# Patient Record
Sex: Female | Born: 1980 | Race: White | Hispanic: No | Marital: Single | Smoking: Never smoker
Health system: Southern US, Community
[De-identification: ages and names within clinical notes are randomized; demographics above are authoritative.]

## PROBLEM LIST (undated history)

## (undated) DIAGNOSIS — O0930 Supervision of pregnancy with insufficient antenatal care, unspecified trimester: Secondary | ICD-10-CM

## (undated) DIAGNOSIS — Z8619 Personal history of other infectious and parasitic diseases: Secondary | ICD-10-CM

## (undated) DIAGNOSIS — Z789 Other specified health status: Secondary | ICD-10-CM

## (undated) HISTORY — DX: Personal history of other infectious and parasitic diseases: Z86.19

## (undated) HISTORY — DX: Supervision of pregnancy with insufficient antenatal care, unspecified trimester: O09.30

## (undated) HISTORY — PX: NO PAST SURGERIES: SHX2092

---
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
## 2003-10-26 ENCOUNTER — Emergency Department (HOSPITAL_COMMUNITY): Admission: EM | Admit: 2003-10-26 | Discharge: 2003-10-26 | Payer: Self-pay | Admitting: Family Medicine

## 2004-12-05 ENCOUNTER — Other Ambulatory Visit: Admission: RE | Admit: 2004-12-05 | Discharge: 2004-12-05 | Payer: Self-pay | Admitting: Obstetrics and Gynecology

## 2005-06-20 ENCOUNTER — Inpatient Hospital Stay (HOSPITAL_COMMUNITY): Admission: AD | Admit: 2005-06-20 | Discharge: 2005-06-22 | Payer: Self-pay | Admitting: Obstetrics and Gynecology

## 2007-02-10 ENCOUNTER — Emergency Department (HOSPITAL_COMMUNITY): Admission: EM | Admit: 2007-02-10 | Discharge: 2007-02-10 | Payer: Self-pay | Admitting: Family Medicine

## 2007-02-13 ENCOUNTER — Emergency Department (HOSPITAL_COMMUNITY): Admission: EM | Admit: 2007-02-13 | Discharge: 2007-02-13 | Payer: Self-pay | Admitting: Emergency Medicine
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
## 2009-04-26 ENCOUNTER — Encounter: Admission: RE | Admit: 2009-04-26 | Discharge: 2009-04-26 | Payer: Self-pay | Admitting: Family Medicine

## 2010-09-23 NOTE — H&P (Signed)
. [facility]  Patient:    Christine Morrow, Christine Morrow Visit Number: [id number] MRN: [id number]          Service Type: Attending:  Naima A. Normand Sloop, M.D. Dictated by:   Nigel Bridgeman, C.N.M. Adm. Date:  12/25/00                           History and Physical  DATE OF BIRTH:                02/20/1981  HISTORY:                      Christine Morrow is an 30 year old gravida 1, para 0 at 3 3/7 weeks who presents today for induction.  Pregnancy has been remarkable for late transfer from [facility], adolescent, positive group B strep, post dates.  HISTORY OF PRESENT PREGNANCY: Patient entered care at [facility] at approximately 11 weeks.  She was followed at [facility] until approximately 27 weeks and transferred to [facility] secondary to medicaid status.  Patient was diagnosed with beta strep at that first visit.  She then transferred [facility] at 29 weeks.  She had a glucola that was normal.  The rest of her pregnancy was essentially uncomplicated.  She had a labor check at 39 weeks which showed her cervix to be 2 cm.  She had an NST at 41 weeks.  At that time her cervix was still 2, 80%, vertex, -1.  Plan was made to schedule her for induction on December 25, 2000 with Pitocin.  PRENATAL LABORATORIES:        Blood type A+.  Rh antibody negative.  VDRL nonreactive.  Rubella titer positive.  Hepatitis B surface antigen negative. GC and chlamydia cultures negative.  Glucose challenge normal.  Hemoglobin upon entry into practice was within normal limits.  AFP was not done secondary to too late.  Beta strep was positive.  Pap showed inflammatory change in May.  EDC of December 15, 2000 was established by last menstrual period and was in agreement with ultrasound at approximately 18 weeks.  Group B strep culture was positive in the early part of her pregnancy.  Glucose challenge was negative.  PAST OBSTETRICAL HISTORY:      Patient is a primigravida.  PAST MEDICAL HISTORY:         She was on Ortho Tri-Cyclen until approximately one year ago.  She has had one yeast infection in the past.  She reports usual childhood illnesses.  ALLERGIES:                    No known drug allergies.  FAMILY HISTORY:               Her maternal grandfather had a heart attack. Her mother has emphysema.  Her brother has ADHD and depression.  GENETIC HISTORY:              Remarkable for the father of the babys nephew born with one extra toe and one extra finger.  SOCIAL HISTORY:               Patient is single.  The father of the baby is involved and supportive.  His name is Christine Morrow.  Patient is Caucasian.  She has a ninth grade education.  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  Her partner also has a ninth grade  education and is employed at a [employer].  She has been followed by the certified nurse midwife service at [facility].  She denies any alcohol, drug, or tobacco use during this pregnancy.  PHYSICAL EXAMINATION  VITAL SIGNS:                  Vital signs are stable.  Patient is afebrile.  HEENT:                        Within normal limits.  LUNGS:                        Bilateral breath sounds are clear.  HEART:                        Regular rate and rhythm without murmur.  BREASTS:                      Soft and nontender.  ABDOMEN:                      Fundal height approximately 39 cm.  Estimated fetal weight 7-7.5 pounds.  Uterine contractions are every five to seven minutes, mild quality.  Fetal heart rate is reactive with no decelerations.  PELVIC:                       Cervix is 2-3 cm, 80%, vertex at a -1 station.  EXTREMITIES:                  Deep tendon reflexes are 2+ without clonus. There is trace edema noted.  IMPRESSION:                   1. Intrauterine pregnancy at 41 3/7 weeks.                               2. Favorable cervix.                               3. Positive group B  strep.  PLAN:                         1. Admit to the birthing suite for consult with                                  Dr. Jaymes Graff as attending physician.                               2. Routine certified nurse midwife orders.                               3. Plan group B strep prophylaxis penicillin G                                  per standard dosing.  4. Plan Pitocin induction of labor secondary to                                  favorable cervix in presence of uterine                                  contractions at this point. Dictated by:   Nigel Bridgeman, C.N.M. Attending:  Naima A. Normand Sloop, M.D. DD:  12/28/00 TD:  12/28/00 Job: [id number]

## 2010-09-23 NOTE — H&P (Signed)
Christine Morrow, [name]:  [id number]   MEDICAL RECORD NO.:  [id number]          PATIENT TYPE:  MAT   LOCATION:  [facility]                          FACILITY:  [facility]   PHYSICIAN:  Osborn Coho, M.D.   DATE OF BIRTH:  [birth year]   DATE OF ADMISSION:  06/20/2005  DATE OF DISCHARGE:                                HISTORY & PHYSICAL   HISTORY OF PRESENT ILLNESS:  [name] is a 30 year old gravida 2, para 1-0-  0-1 at 38-3/7 weeks, July 01, 2005, who presents on admission in early  active labor with spontaneous rupture of membranes with clear fluid. She  reports positive fetal movement. No vaginal bleeding. She was seen earlier  today in MAU and sent home with reactive NST. She was known be contracting  every to 1-2 minutes at that time, however, her cervix was unchanged at 2,  70% and -2. She denies any headache, visual changes or epigastric pain. Her  pregnancy has been followed by the C.N.M. service at [facility] and is remarkable  for:  1.  Positive chlamydia at new OB visit on December 05, 2004. She had a negative      test of cure on December 31, 2004.  2.  She is varicella equivocal.  3.  Group B strep positive.   This patient presented to prenatal care on December 05, 2004 at approximately 10  weeks' gestation. Estimated date of confinement determined a 16-weeks  ultrasound and confirmed with follow-up. The patient's pregnancy has been  essentially unremarkable. She has been size equal to dates throughout. She  has been normotensive with no proteinuria. Prenatal lab work on July  31,2006: hemoglobin and hematocrit 12.6 and 37.2, platelets 262,000. Blood  type and Rh A positive, antibody screen negative, VDRL nonreactive, rubella  immune, hepatitis B surface antigen negative, HIV nonreactive. Pap smear  within normal limits. GC negative, chlamydia positive on December 05, 2004; test  of cure was negative on January 02, 2005. At 36 weeks culture of the vaginal  tract is negative  for GC and chlamydia and quadruple screen is within normal  limits. 28-week 1-hour Glucola 117. RPR nonreactive. Varicella equivocal.  Then again at 36 weeks culture of the vaginal tract is negative for GC and  chlamydia. The patient is positive for group B strep.   OBSTETRIC HISTORY:  In 2002 the patient had a normal spontaneous vaginal  delivery at term with birth of an 8 pound, 0 ounce female infant named Christine Morrow  with no complications. This is her second and current pregnancy.   ALLERGIES:  No known drug allergies.   HABITS:  She denies the use of tobacco, alcohol or illicit drugs.   PAST MEDICAL HISTORY:  History of positive chlamydia at new OB visit with  test of cure negative.   FAMILY HISTORY:  Maternal grandmother with a history of heart disease. The  patient's father with a history of diabetes. The patient's brother has  bipolar disorder and ADHD. Mother smokes cigarettes.   GENETIC HISTORY:  The father of the baby's sister had a child with  a extra  fingers and extra toes.   SOCIAL HISTORY:  [name] is a 30 year old single Caucasian female.  Candelaria Celeste the father of the baby is sporadically involved.  They do not  subscribe to a religious faith.   REVIEW OF SYSTEMS:  There are no signs or symptoms suggestive of focal or  systemic disease. The patient is typical of one with uterine pregnancy at  term in early active labor.   PHYSICAL EXAMINATION:  VITAL SIGNS:  Stable, afebrile.  HEENT:  Unremarkable.  HEART:  Regular rate and rhythm.  LUNGS:  Clear.  ABDOMEN:  Gravid in its contour. Uterine fundus is noted to extend 38 cm  above the level of the pubic symphysis. Leopold's maneuvers finds the infant  to be a longitudinal lie, cephalic presentation and the estimated fetal  weight is 7-1/2 pounds. The baseline of the fetal heart rate monitor is  130's with average long-term variability, reactivity is present with no  periodic changes. The patient is contracting  every 2 minutes. Digital exam  of the cervix finds it to be 3-4 cm dilated, 90% effaced with the cephalic  presenting part at -1 station. Amniotic fluid is clear.  EXTREMITIES:  Show no pathologic edema. DTRs 1+ with no clonus. There is no  calf tenderness noted bilaterally.   ASSESSMENT:  Intrauterine pregnancy at term, early active labor.   PLAN:  Admit to routine C.N.M. orders. Penicillin G prophylaxis for positive  group B strep.      Rica Koyanagi, C.N.M.      Osborn Coho, M.D.  Electronically Signed    SDM/MEDQ  D:  06/20/2005  T:  06/20/2005  Job:  [id number]

## 2011-02-16 LAB — URINALYSIS, ROUTINE W REFLEX MICROSCOPIC
Bilirubin Urine: NEGATIVE
Hgb urine dipstick: NEGATIVE
Ketones, ur: 40 — AB
Nitrite: NEGATIVE
Urobilinogen, UA: 1

## 2011-02-16 LAB — RAPID STREP SCREEN (MED CTR MEBANE ONLY): Streptococcus, Group A Screen (Direct): NEGATIVE

## 2011-03-08 LAB — OB RESULTS CONSOLE RPR: RPR: NONREACTIVE

## 2011-03-08 LAB — OB RESULTS CONSOLE HIV ANTIBODY (ROUTINE TESTING): HIV: NONREACTIVE

## 2011-03-08 LAB — OB RESULTS CONSOLE ABO/RH

## 2011-03-08 LAB — OB RESULTS CONSOLE GC/CHLAMYDIA: Gonorrhea: NEGATIVE

## 2011-03-08 LAB — OB RESULTS CONSOLE RUBELLA ANTIBODY, IGM: Rubella: IMMUNE

## 2011-05-09 NOTE — L&D Delivery Note (Signed)
Delivery Note At 1:57 PM a healthy female was delivered via Vaginal, Spontaneous Delivery (Presentation: Right Occiput Anterior). Compound right arm. APGAR: 9, 9; weight pending.   Placenta status: Intact, Spontaneous.  Cord: 3 vessels with the following complications: None.  Cord pH: NA  Anesthesia: Epidural  Episiotomy: None Lacerations: None Suture Repair: none Est. Blood Loss (mL): 400 Cytotec given  Mom to postpartum.  Baby to nursery-stable. Placenta to: BS Feeding: Breast Circ: NA Contraception: undecided  Dorathy Kinsman 09/03/2011, 2:26 PM

## 2011-07-12 ENCOUNTER — Encounter (INDEPENDENT_AMBULATORY_CARE_PROVIDER_SITE_OTHER): Payer: Medicaid Other

## 2011-07-12 DIAGNOSIS — Z331 Pregnant state, incidental: Secondary | ICD-10-CM

## 2011-07-26 ENCOUNTER — Encounter (INDEPENDENT_AMBULATORY_CARE_PROVIDER_SITE_OTHER): Payer: Medicaid Other | Admitting: Obstetrics and Gynecology

## 2011-07-26 DIAGNOSIS — Z3480 Encounter for supervision of other normal pregnancy, unspecified trimester: Secondary | ICD-10-CM

## 2011-08-07 ENCOUNTER — Inpatient Hospital Stay (HOSPITAL_COMMUNITY)
Admission: AD | Admit: 2011-08-07 | Discharge: 2011-08-08 | Disposition: A | Discharge status: Home / Self Care | Payer: Medicaid Other | Source: Ambulatory Visit | Attending: Obstetrics and Gynecology | Admitting: Obstetrics and Gynecology

## 2011-08-07 ENCOUNTER — Encounter (HOSPITAL_COMMUNITY): Payer: Self-pay | Admitting: *Deleted

## 2011-08-07 DIAGNOSIS — O4700 False labor before 37 completed weeks of gestation, unspecified trimester: Secondary | ICD-10-CM | POA: Insufficient documentation

## 2011-08-07 DIAGNOSIS — O479 False labor, unspecified: Secondary | ICD-10-CM

## 2011-08-07 HISTORY — DX: Other specified health status: Z78.9

## 2011-08-07 LAB — URINALYSIS, DIPSTICK ONLY
Bilirubin Urine: NEGATIVE
Hgb urine dipstick: NEGATIVE
Ketones, ur: NEGATIVE mg/dL
Nitrite: NEGATIVE
Urobilinogen, UA: 0.2 mg/dL (ref 0.0–1.0)

## 2011-08-07 LAB — WET PREP, GENITAL
Clue Cells Wet Prep HPF POC: NONE SEEN
Trich, Wet Prep: NONE SEEN

## 2011-08-07 MED ORDER — LACTATED RINGERS IV BOLUS (SEPSIS)
1000.0000 mL | Freq: Once | INTRAVENOUS | Status: AC
  Administered 2011-08-07: 1000 mL via INTRAVENOUS

## 2011-08-07 MED ORDER — BUTORPHANOL TARTRATE 2 MG/ML IJ SOLN
1.0000 mg | Freq: Once | INTRAMUSCULAR | Status: AC
  Administered 2011-08-07: 1 mg via INTRAVENOUS
  Filled 2011-08-07: qty 1

## 2011-08-07 NOTE — MAU Note (Signed)
Contractions x 4 hours, nausea, pressure, lower back pain

## 2011-08-08 DIAGNOSIS — O4700 False labor before 37 completed weeks of gestation, unspecified trimester: Secondary | ICD-10-CM

## 2011-08-08 DIAGNOSIS — N898 Other specified noninflammatory disorders of vagina: Secondary | ICD-10-CM

## 2011-08-08 LAB — GC/CHLAMYDIA PROBE AMP, GENITAL
Chlamydia, DNA Probe: NEGATIVE
GC Probe Amp, Genital: NEGATIVE

## 2011-08-08 NOTE — Discharge Instructions (Signed)
Preventing Preterm Labor Preterm labor is when a pregnant woman has contractions that cause the cervix to open, shorten, and thin before 37 weeks of pregnancy. You will have regular contractions (tightening) 2 to 3 minutes apart. This usually causes discomfort or pain. HOME CARE  Eat a healthy diet.   Take your vitamins as told by your doctor.   Drink enough fluids to keep your pee (urine) clear or pale yellow every day.   Get rest and sleep.   Do not have sex if you are at high risk for preterm labor.   Follow your doctor's advice about activity, medicines, and tests.   Avoid stress.   Avoid hard labor or exercise that lasts for a long time.   Do not smoke.  GET HELP RIGHT AWAY IF:   You are having contractions.   You have belly (abdominal) pain.   You have bleeding from your vagina.   You have pain when you pee (urinate).   You have abnormal discharge from your vagina.   You have a temperature by mouth above 102 F (38.9 C).  MAKE SURE YOU:  Understand these instructions.   Will watch your condition.   Will get help if you are not doing well or get worse.  Document Released: 07/21/2008 Document Revised: 04/13/2011 Document Reviewed: 07/21/2008 Exit[employer] Patient Information 2012 [employer]. 

## 2011-08-08 NOTE — MAU Provider Note (Signed)
History   Christine Morrow is a 30y.o. White female who presents for PTL eval after calling around 2100 to report ctxs since 1730.  Also c/o mucousy d/c today and nausea, but no vomiting.  No VB or LOF.  Reporting "back pain," and feeling constipated.  Reported ctxs q 6-8 minutes.  Pt works full-time at [employer].  She is accompanied by her s.o.  Denies any fever, chills, illness, or resp c/o's.  Reports fetus very active tonight.  Last Intercourse 1-2 weeks ago. Pregnancy r/f: 1.  H/o chlamydia 2.  Late to care  CSN: [id number]  Arrival date and time: 08/07/11 2138   First Provider Initiated Contact with Patient 08/08/11 0023      Chief Complaint  Patient presents with  . Labor Eval   HPI  OB History    Grav Para Term Preterm Abortions TAB SAB Ect Mult Living   3 2 2       2       Past Medical History  Diagnosis Date  . No pertinent past medical history     Past Surgical History  Procedure Date  . No past surgeries     Family History  Problem Relation Age of Onset  . Anesthesia problems Neg Hx     History  Substance Use Topics  . Smoking status: Never Smoker   . Smokeless tobacco: Not on file  . Alcohol Use: No    Allergies: No Known Allergies  Prescriptions prior to admission  Medication Sig Dispense Refill  . prenatal vitamin w/FE, FA (NATACHEW) 29-1 MG CHEW Chew 1 tablet by mouth daily.        ROS--see history above Physical Exam  EFM;  130, reactive, moderate variability, no decels TOCO:  Initially UC's 2-3 minutes, and after IVF and Stadol, spaced to occasional ctx  Blood pressure 127/66, pulse 74, temperature 99.2 F (37.3 C), temperature source Oral, resp. rate 18, height 5' 3.5" (1.613 m), weight 82.101 kg (181 lb).  Physical Exam  Constitutional: She is oriented to person, place, and time. She appears well-developed and well-nourished.       Anxious on arrival, but no labored breathing or grimace  Cardiovascular: Normal rate.   Respiratory: Effort  normal.  GI: Soft.       gravid  Genitourinary:       No pooling; no blood.  No lesions. Cx:  Long/closed; medium firm; -2  Neurological: She is alert and oriented to person, place, and time.  Skin: Skin is warm and dry.   .. Results for orders placed during the hospital encounter of 08/07/11 (from the past 24 hour(s))  URINALYSIS, DIPSTICK ONLY     Status: Normal   Collection Time   08/07/11  9:50 PM      Component Value Range   Specific Gravity, Urine 1.015  1.005 - 1.030    pH 7.5  5.0 - 8.0    Glucose, UA NEGATIVE  NEGATIVE (mg/dL)   Hgb urine dipstick NEGATIVE  NEGATIVE    Bilirubin Urine NEGATIVE  NEGATIVE    Ketones, ur NEGATIVE  NEGATIVE (mg/dL)   Protein, ur NEGATIVE  NEGATIVE (mg/dL)   Urobilinogen, UA 0.2  0.0 - 1.0 (mg/dL)   Nitrite NEGATIVE  NEGATIVE    Leukocytes, UA NEGATIVE  NEGATIVE   WET PREP, GENITAL     Status: Abnormal   Collection Time   08/07/11 10:15 PM      Component Value Range   Yeast Wet Prep HPF POC NONE  SEEN  NONE SEEN    Trich, Wet Prep NONE SEEN  NONE SEEN    Clue Cells Wet Prep HPF POC NONE SEEN  NONE SEEN    WBC, Wet Prep HPF POC FEW (*) NONE SEEN    MAU Course  Procedures 1.  1 Liter LR 2.  Stadol 1 mg IV x1 3.  Wet prep 4.  Gc/ct cx--pending at time of d/c 5.  GBS cx--pending at time of d/c 6. u/a Assessment and Plan  1.  IUP at 35.6 weeks 2.  Preterm contractions which spaced after IVF and Stadol x1 3.  cx long and closed 4.  Cat I FHT  1.  D/c'd home after ctxs spaced with PTL precautions and note to remain OOW until seen at her appt Wed 08/09/11 and has cx rechecked. 2.  F/u Wed at office or prn   Christine Morrow H 08/08/2011, 12:27 AM

## 2011-08-09 ENCOUNTER — Encounter (INDEPENDENT_AMBULATORY_CARE_PROVIDER_SITE_OTHER): Payer: Medicaid Other | Admitting: Obstetrics and Gynecology

## 2011-08-09 DIAGNOSIS — Z331 Pregnant state, incidental: Secondary | ICD-10-CM

## 2011-08-09 LAB — OB RESULTS CONSOLE GBS: GBS: NEGATIVE

## 2011-08-10 LAB — CULTURE, BETA STREP (GROUP B ONLY)

## 2011-08-15 ENCOUNTER — Ambulatory Visit (INDEPENDENT_AMBULATORY_CARE_PROVIDER_SITE_OTHER): Payer: Medicaid Other | Admitting: Obstetrics and Gynecology

## 2011-08-15 VITALS — BP 100/64 | Wt 180.0 lb

## 2011-08-15 DIAGNOSIS — O0930 Supervision of pregnancy with insufficient antenatal care, unspecified trimester: Secondary | ICD-10-CM

## 2011-08-15 DIAGNOSIS — Z3480 Encounter for supervision of other normal pregnancy, unspecified trimester: Secondary | ICD-10-CM

## 2011-08-15 NOTE — Patient Instructions (Signed)
Normal Labor and Delivery Your caregiver must first be sure you are in labor. Signs of labor include:  You may pass what is called "the mucus plug" before labor begins. This is a small amount of blood stained mucus.   Regular uterine contractions.   The time between contractions get closer together.   The discomfort and pain gradually gets more intense.   Pains are mostly located in the back.   Pains get worse when walking.   The cervix (the opening of the uterus becomes thinner (begins to efface) and opens up (dilates).  Once you are in labor and admitted into the hospital or care center, your caregiver will do the following:  A complete physical examination.   Check your vital signs (blood pressure, pulse, temperature and the fetal heart rate).   Do a vaginal examination (using a sterile glove and lubricant) to determine:   The position (presentation) of the baby (head [vertex] or buttock first).   The level (station) of the baby's head in the birth canal.   The effacement and dilatation of the cervix.   You may have your pubic hair shaved and be given an enema depending on your caregiver and the circumstance.   An electronic monitor is usually placed on your abdomen. The monitor follows the length and intensity of the contractions, as well as the baby's heart rate.   Usually, your caregiver will insert an IV in your arm with a bottle of sugar water. This is done as a precaution so that medications can be given to you quickly during labor or delivery.  NORMAL LABOR AND DELIVERY IS DIVIDED UP INTO 3 STAGES: First Stage This is when regular contractions begin and the cervix begins to efface and dilate. This stage can last from 3 to 15 hours. The end of the first stage is when the cervix is 100% effaced and 10 centimeters dilated. Pain medications may be given by   Injection (morphine, demerol, etc.)   Regional anesthesia (spinal, caudal or epidural, anesthetics given in  different locations of the spine). Paracervical pain medication may be given, which is an injection of and anesthetic on each side of the cervix.  A pregnant woman may request to have "Natural Childbirth" which is not to have any medications or anesthesia during her labor and delivery. Second Stage This is when the baby comes down through the birth canal (vagina) and is born. This can take 1 to 4 hours. As the baby's head comes down through the birth canal, you may feel like you are going to have a bowel movement. You will get the urge to bear down and push until the baby is delivered. As the baby's head is being delivered, the caregiver will decide if an episiotomy (a cut in the perineum and vagina area) is needed to prevent tearing of the tissue in this area. The episiotomy is sewn up after the delivery of the baby and placenta. Sometimes a mask with nitrous oxide is given for the mother to breath during the delivery of the baby to help if there is too much pain. The end of Stage 2 is when the baby is fully delivered. Then when the umbilical cord stops pulsating it is clamped and cut. Third Stage The third stage begins after the baby is completely delivered and ends after the placenta (afterbirth) is delivered. This usually takes 5 to 30 minutes. After the placenta is delivered, a medication is given either by intravenous or injection to help contract   the uterus and prevent bleeding. The third stage is not painful and pain medication is usually not necessary. If an episiotomy was done, it is repaired at this time. After the delivery, the mother is watched and monitored closely for 1 to 2 hours to make sure there is no postpartum bleeding (hemorrhage). If there is a lot of bleeding, medication is given to contract the uterus and stop the bleeding. Document Released: 02/01/2008 Document Revised: 04/13/2011 Document Reviewed: 02/01/2008 Exit[employer] Patient Information 2012 [employer]. 

## 2011-08-15 NOTE — Progress Notes (Signed)
Cc.pt. Wants cervix check today.

## 2011-08-16 DIAGNOSIS — Z3480 Encounter for supervision of other normal pregnancy, unspecified trimester: Secondary | ICD-10-CM | POA: Insufficient documentation

## 2011-08-18 ENCOUNTER — Encounter (HOSPITAL_COMMUNITY): Payer: Self-pay | Admitting: *Deleted

## 2011-08-18 ENCOUNTER — Telehealth: Payer: Self-pay | Admitting: Obstetrics and Gynecology

## 2011-08-18 ENCOUNTER — Inpatient Hospital Stay (HOSPITAL_COMMUNITY)
Admission: AD | Admit: 2011-08-18 | Discharge: 2011-08-18 | Disposition: A | Discharge status: Home / Self Care | Payer: Medicaid Other | Source: Ambulatory Visit | Attending: Obstetrics and Gynecology | Admitting: Obstetrics and Gynecology

## 2011-08-18 DIAGNOSIS — O479 False labor, unspecified: Secondary | ICD-10-CM | POA: Insufficient documentation

## 2011-08-18 DIAGNOSIS — O4700 False labor before 37 completed weeks of gestation, unspecified trimester: Secondary | ICD-10-CM

## 2011-08-18 NOTE — Discharge Instructions (Signed)
Fetal Movement Counts Patient Name: __________________________________________________ Patient Due Date: ____________________ Kick counts is highly recommended in high risk pregnancies, but it is a good idea for every pregnant woman to do. Start counting fetal movements at 28 weeks of the pregnancy. Fetal movements increase after eating a full meal or eating or drinking something sweet (the blood sugar is higher). It is also important to drink plenty of fluids (well hydrated) before doing the count. Lie on your left side because it helps with the circulation or you can sit in a comfortable chair with your arms over your belly (abdomen) with no distractions around you. DOING THE COUNT  Try to do the count the same time of day each time you do it.   Mark the day and time, then see how long it takes for you to feel 10 movements (kicks, flutters, swishes, rolls). You should have at least 10 movements within 2 hours. You will most likely feel 10 movements in much less than 2 hours. If you do not, wait an hour and count again. After a couple of days you will see a pattern.   What you are looking for is a change in the pattern or not enough counts in 2 hours. Is it taking longer in time to reach 10 movements?  SEEK MEDICAL CARE IF:  You feel less than 10 counts in 2 hours. Tried twice.   No movement in one hour.   The pattern is changing or taking longer each day to reach 10 counts in 2 hours.   You feel the baby is not moving as it usually does.  Date: ____________ Movements: ____________ Start time: ____________ Finish time: ____________  Date: ____________ Movements: ____________ Start time: ____________ Finish time: ____________ Date: ____________ Movements: ____________ Start time: ____________ Finish time: ____________ Date: ____________ Movements: ____________ Start time: ____________ Finish time: ____________ Date: ____________ Movements: ____________ Start time: ____________ Finish time:  ____________ Date: ____________ Movements: ____________ Start time: ____________ Finish time: ____________ Date: ____________ Movements: ____________ Start time: ____________ Finish time: ____________ Date: ____________ Movements: ____________ Start time: ____________ Finish time: ____________  Date: ____________ Movements: ____________ Start time: ____________ Finish time: ____________ Date: ____________ Movements: ____________ Start time: ____________ Finish time: ____________ Date: ____________ Movements: ____________ Start time: ____________ Finish time: ____________ Date: ____________ Movements: ____________ Start time: ____________ Finish time: ____________ Date: ____________ Movements: ____________ Start time: ____________ Finish time: ____________ Date: ____________ Movements: ____________ Start time: ____________ Finish time: ____________ Date: ____________ Movements: ____________ Start time: ____________ Finish time: ____________  Date: ____________ Movements: ____________ Start time: ____________ Finish time: ____________ Date: ____________ Movements: ____________ Start time: ____________ Finish time: ____________ Date: ____________ Movements: ____________ Start time: ____________ Finish time: ____________ Date: ____________ Movements: ____________ Start time: ____________ Finish time: ____________ Date: ____________ Movements: ____________ Start time: ____________ Finish time: ____________ Date: ____________ Movements: ____________ Start time: ____________ Finish time: ____________ Date: ____________ Movements: ____________ Start time: ____________ Finish time: ____________  Date: ____________ Movements: ____________ Start time: ____________ Finish time: ____________ Date: ____________ Movements: ____________ Start time: ____________ Finish time: ____________ Date: ____________ Movements: ____________ Start time: ____________ Finish time: ____________ Date: ____________ Movements:  ____________ Start time: ____________ Finish time: ____________ Date: ____________ Movements: ____________ Start time: ____________ Finish time: ____________ Date: ____________ Movements: ____________ Start time: ____________ Finish time: ____________ Date: ____________ Movements: ____________ Start time: ____________ Finish time: ____________  Date: ____________ Movements: ____________ Start time: ____________ Finish time: ____________ Date: ____________ Movements: ____________ Start time: ____________ Finish time: ____________ Date: ____________ Movements: ____________ Start time:   ____________ Finish time: ____________ Date: ____________ Movements: ____________ Start time: ____________ Finish time: ____________ Date: ____________ Movements: ____________ Start time: ____________ Finish time: ____________ Date: ____________ Movements: ____________ Start time: ____________ Finish time: ____________ Date: ____________ Movements: ____________ Start time: ____________ Finish time: ____________  Date: ____________ Movements: ____________ Start time: ____________ Finish time: ____________ Date: ____________ Movements: ____________ Start time: ____________ Finish time: ____________ Date: ____________ Movements: ____________ Start time: ____________ Finish time: ____________ Date: ____________ Movements: ____________ Start time: ____________ Finish time: ____________ Date: ____________ Movements: ____________ Start time: ____________ Finish time: ____________ Date: ____________ Movements: ____________ Start time: ____________ Finish time: ____________ Date: ____________ Movements: ____________ Start time: ____________ Finish time: ____________  Date: ____________ Movements: ____________ Start time: ____________ Finish time: ____________ Date: ____________ Movements: ____________ Start time: ____________ Finish time: ____________ Date: ____________ Movements: ____________ Start time: ____________ Finish  time: ____________ Date: ____________ Movements: ____________ Start time: ____________ Finish time: ____________ Date: ____________ Movements: ____________ Start time: ____________ Finish time: ____________ Date: ____________ Movements: ____________ Start time: ____________ Finish time: ____________ Date: ____________ Movements: ____________ Start time: ____________ Finish time: ____________  Date: ____________ Movements: ____________ Start time: ____________ Finish time: ____________ Date: ____________ Movements: ____________ Start time: ____________ Finish time: ____________ Date: ____________ Movements: ____________ Start time: ____________ Finish time: ____________ Date: ____________ Movements: ____________ Start time: ____________ Finish time: ____________ Date: ____________ Movements: ____________ Start time: ____________ Finish time: ____________ Date: ____________ Movements: ____________ Start time: ____________ Finish time: ____________ Document Released: 05/24/2006 Document Revised: 04/13/2011 Document Reviewed: 11/24/2008 Exit[employer] Patient Information 2012 [employer].Normal Labor and Delivery Your caregiver must first be sure you are in labor. Signs of labor include:  You may pass what is called "the mucus plug" before labor begins. This is a small amount of blood stained mucus.   Regular uterine contractions.   The time between contractions get closer together.   The discomfort and pain gradually gets more intense.   Pains are mostly located in the back.   Pains get worse when walking.   The cervix (the opening of the uterus becomes thinner (begins to efface) and opens up (dilates).  Once you are in labor and admitted into the hospital or care center, your caregiver will do the following:  A complete physical examination.   Check your vital signs (blood pressure, pulse, temperature and the fetal heart rate).   Do a vaginal examination (using a sterile glove and  lubricant) to determine:   The position (presentation) of the baby (head [vertex] or buttock first).   The level (station) of the baby's head in the birth canal.   The effacement and dilatation of the cervix.   You may have your pubic hair shaved and be given an enema depending on your caregiver and the circumstance.   An electronic monitor is usually placed on your abdomen. The monitor follows the length and intensity of the contractions, as well as the baby's heart rate.   Usually, your caregiver will insert an IV in your arm with a bottle of sugar water. This is done as a precaution so that medications can be given to you quickly during labor or delivery.  NORMAL LABOR AND DELIVERY IS DIVIDED UP INTO 3 STAGES: First Stage This is when regular contractions begin and the cervix begins to efface and dilate. This stage can last from 3 to 15 hours. The end of the first stage is when the cervix is 100% effaced and 10 centimeters dilated. Pain medications may be given by   Injection (morphine, demerol,   etc.)   Regional anesthesia (spinal, caudal or epidural, anesthetics given in different locations of the spine). Paracervical pain medication may be given, which is an injection of and anesthetic on each side of the cervix.  A pregnant woman may request to have "Natural Childbirth" which is not to have any medications or anesthesia during her labor and delivery. Second Stage This is when the baby comes down through the birth canal (vagina) and is born. This can take 1 to 4 hours. As the baby's head comes down through the birth canal, you may feel like you are going to have a bowel movement. You will get the urge to bear down and push until the baby is delivered. As the baby's head is being delivered, the caregiver will decide if an episiotomy (a cut in the perineum and vagina area) is needed to prevent tearing of the tissue in this area. The episiotomy is sewn up after the delivery of the baby and  placenta. Sometimes a mask with nitrous oxide is given for the mother to breath during the delivery of the baby to help if there is too much pain. The end of Stage 2 is when the baby is fully delivered. Then when the umbilical cord stops pulsating it is clamped and cut. Third Stage The third stage begins after the baby is completely delivered and ends after the placenta (afterbirth) is delivered. This usually takes 5 to 30 minutes. After the placenta is delivered, a medication is given either by intravenous or injection to help contract the uterus and prevent bleeding. The third stage is not painful and pain medication is usually not necessary. If an episiotomy was done, it is repaired at this time. After the delivery, the mother is watched and monitored closely for 1 to 2 hours to make sure there is no postpartum bleeding (hemorrhage). If there is a lot of bleeding, medication is given to contract the uterus and stop the bleeding. Document Released: 02/01/2008 Document Revised: 04/13/2011 Document Reviewed: 02/01/2008 Exit[employer] Patient Information 2012 [employer].  

## 2011-08-18 NOTE — MAU Note (Signed)
Pt in c/o leaking of clear fluid since around 1000  Reports ucs q2 minutes, but not very strong. Denies any bleeding. + FM.

## 2011-08-18 NOTE — MAU Provider Note (Signed)
  History   Christine Morrow is a 30y.o. WF who presents at 37.2 weeks for r/o ROM and labor check.  Has felt underwear more "damp" today since around lunch, but no gushes of fluid.  No VB.  Cx 2 cm on Tuesday of this week.  GFM.  Feels "more pressure" today w/ ctxs.  Ctx regular per pt, but "not painful."  GFM.  No UTI or PIH s/s.   Pregnancy r/f: 1.  Late to care 2.  H/o chlamydia  CSN: [id number]  Arrival date and time: 08/18/11 1600   First Provider Initiated Contact with Patient 08/18/11 1616      Chief Complaint  Patient presents with  . Rupture of Membranes   HPI  OB History    Grav Para Term Preterm Abortions TAB SAB Ect Mult Living   3 2 2  0 0 0 0 0 0 2      Past Medical History  Diagnosis Date  . No pertinent past medical history     Past Surgical History  Procedure Date  . No past surgeries     Family History  Problem Relation Age of Onset  . Anesthesia problems Neg Hx     History  Substance Use Topics  . Smoking status: Never Smoker   . Smokeless tobacco: Not on file  . Alcohol Use: No    Allergies: No Known Allergies  Prescriptions prior to admission  Medication Sig Dispense Refill  . prenatal vitamin w/FE, FA (NATACHEW) 29-1 MG CHEW Chew 1 tablet by mouth daily.        ROS--see history above Physical Exam  EFM:  125, reactive, moderate variability, one mild variable TOCO:  UC's q 2-4 min (appear more like UI)  Blood pressure 116/73, pulse 79, temperature 98.6 F (37 C), temperature source Oral, resp. rate 18, height 5' 3.5" (1.613 m), weight 81.647 kg (180 lb).  Physical Exam  Constitutional: She is oriented to person, place, and time. She appears well-developed and well-nourished. No distress.  Cardiovascular: Normal rate.   Respiratory: Effort normal.  GI: Soft.       gravid  Genitourinary:       SSE:  No pooling; sm amt white thin d/c in vault Cx:  2/60/-2, anterior above symphysis  Neurological: She is alert and oriented to person,  place, and time.  Skin: Skin is warm and dry.  Psychiatric: She has a normal mood and affect. Her behavior is normal. Judgment and thought content normal.  .. Results for orders placed during the hospital encounter of 08/18/11 (from the past 24 hour(s))  AMNISURE RUPTURE OF MEMBRANE (ROM)     Status: Normal   Collection Time   08/18/11  4:20 PM      Component Value Range   Amnisure ROM NEGATIVE    POCT FERN TEST     Status: Normal   Collection Time   08/18/11  4:20 PM      Component Value Range   Fern Test Negative      MAU Course  Procedures 1.  NST  2.  amnisure  Assessment and Plan  1.  IUP at 37.2 2.  No s/s of rupture and negative amnisure 3.  Ctxs, but no cervical change and "not painful" 4.  Cat I FHT and reactive  1.  D/c home w/ labor precautions and FKC 2.  F/u next week in office as scheduled or prn 3.  Leakage s/s and precautions rev'd  Kylea Berrong H 08/18/2011, 4:25 PM

## 2011-08-18 NOTE — Telephone Encounter (Signed)
Spoke with pt c/o leaking fluids, no bleeding, contracting every 1-2 minutes, +FM. Informed Skeet Simmer pt to hospital. Pt reqs to come to office rather than hospital. Informed pt no available appts close to closing for the day. Pt agrees and voices understanding.

## 2011-08-22 ENCOUNTER — Ambulatory Visit (INDEPENDENT_AMBULATORY_CARE_PROVIDER_SITE_OTHER): Payer: Medicaid Other | Admitting: Obstetrics and Gynecology

## 2011-08-22 VITALS — BP 100/62 | Wt 182.0 lb

## 2011-08-22 DIAGNOSIS — Z3490 Encounter for supervision of normal pregnancy, unspecified, unspecified trimester: Secondary | ICD-10-CM

## 2011-08-22 DIAGNOSIS — Z3480 Encounter for supervision of other normal pregnancy, unspecified trimester: Secondary | ICD-10-CM

## 2011-08-22 MED ORDER — ZOLPIDEM TARTRATE 5 MG PO TABS: 5.0000 mg | ORAL_TABLET | Freq: Every evening | ORAL | Status: DC | PRN

## 2011-08-22 NOTE — Progress Notes (Signed)
C/o difficulty sleeping secondary to irregular ctx and FM GFM Seen at [facility] on 4-12 for r/o ROM GBS neg Rv'd labor sx's and FKC Rx for ambien 5mg  PRN sleep 1-2 tablets

## 2011-08-25 ENCOUNTER — Telehealth: Payer: Self-pay | Admitting: Obstetrics and Gynecology

## 2011-08-25 ENCOUNTER — Encounter (HOSPITAL_COMMUNITY): Payer: Self-pay | Admitting: *Deleted

## 2011-08-25 ENCOUNTER — Inpatient Hospital Stay (HOSPITAL_COMMUNITY)
Admission: AD | Admit: 2011-08-25 | Discharge: 2011-08-25 | Disposition: A | Discharge status: Home / Self Care | Payer: Medicaid Other | Source: Ambulatory Visit | Attending: Obstetrics and Gynecology | Admitting: Obstetrics and Gynecology

## 2011-08-25 DIAGNOSIS — O479 False labor, unspecified: Secondary | ICD-10-CM | POA: Insufficient documentation

## 2011-08-25 NOTE — MAU Provider Note (Signed)
  History   Christine Morrow is a 30y.o. MWF at 38.2 weeks who presents for labor check w/ CC of ctxs every 2-3 min since 0100.  Denies LOF, VB, UTI or PIH s/s.  Very active fetus.  Has been evaluated in MAU several times over the last 1-2 weeks for labor checks, r/o ROM.  Not working today.  Given Rx for Ambien this week at her appt at [facility], but hasn't taken b/c at pharmacy they said, "you're not pregnant are you?"  Pt has h/o rapid labor and is "nervous" may miss signs b/c she has been having such regular ctxs for several weeks now.  States, "I thought I waited long enough this time."   Pregnancy r/f: 1.  Late to care 2.  H/o chlamydia  CSN: [id number]  Arrival date and time: 08/25/11 1126   None     Chief Complaint  Patient presents with  . Labor Eval   HPI  OB History    Grav Para Term Preterm Abortions TAB SAB Ect Mult Living   3 2 2  0 0 0 0 0 0 2      Past Medical History  Diagnosis Date  . No pertinent past medical history     Past Surgical History  Procedure Date  . No past surgeries     Family History  Problem Relation Age of Onset  . Anesthesia problems Neg Hx     History  Substance Use Topics  . Smoking status: Never Smoker   . Smokeless tobacco: Not on file  . Alcohol Use: No    Allergies: No Known Allergies  Prescriptions prior to admission  Medication Sig Dispense Refill  . calcium carbonate (TUMS - DOSED IN MG ELEMENTAL CALCIUM) 500 MG chewable tablet Chew 2 tablets by mouth daily as needed. For heartburn      . prenatal vitamin w/FE, FA (NATACHEW) 29-1 MG CHEW Chew 1 tablet by mouth daily.        ROS--see history above Physical Exam  EFM:  125, reactive, moderate variability, no decels TOCO:  UC's q 2-3 min; mild to mod on palpation  Blood pressure 114/54, pulse 79, temperature 97.9 F (36.6 C), temperature source Oral, resp. rate 20, height 5' 3.5" (1.613 m), weight 81.829 kg (180 lb 6.4 oz), SpO2 100.00%.  Physical Exam  Constitutional: She is  oriented to person, place, and time. She appears well-developed and well-nourished.       Grimace w/ some ctxs  Cardiovascular: Normal rate.   Respiratory: Effort normal.  GI: Soft.       Gravid   Genitourinary:       Cx:  1-2/60/-2; anterior  Neurological: She is alert and oriented to person, place, and time.  Skin: Skin is warm and dry.    MAU Course  Procedures 1.  NST  Assessment and Plan  1.  IUP at 38.2 2.  Regular ctxs, but no cervical change 3.  Reactive NST 4.  GBS neg 5.  Prodromal vs early labor  1.  D/c home w/ labor precautions; reassured pt Ambien ok to take in pregnancy for insomnia. 2.  Support given and rev'd comfort measures.   3.  F/u as scheduled or prn  Linkon Siverson H 08/25/2011, 12:15 PM

## 2011-08-25 NOTE — Discharge Instructions (Signed)
Normal Labor and Delivery Your caregiver must first be sure you are in labor. Signs of labor include:  You may pass what is called "the mucus plug" before labor begins. This is a small amount of blood stained mucus.   Regular uterine contractions.   The time between contractions get closer together.   The discomfort and pain gradually gets more intense.   Pains are mostly located in the back.   Pains get worse when walking.   The cervix (the opening of the uterus becomes thinner (begins to efface) and opens up (dilates).  Once you are in labor and admitted into the hospital or care center, your caregiver will do the following:  A complete physical examination.   Check your vital signs (blood pressure, pulse, temperature and the fetal heart rate).   Do a vaginal examination (using a sterile glove and lubricant) to determine:   The position (presentation) of the baby (head [vertex] or buttock first).   The level (station) of the baby's head in the birth canal.   The effacement and dilatation of the cervix.   You may have your pubic hair shaved and be given an enema depending on your caregiver and the circumstance.   An electronic monitor is usually placed on your abdomen. The monitor follows the length and intensity of the contractions, as well as the baby's heart rate.   Usually, your caregiver will insert an IV in your arm with a bottle of sugar water. This is done as a precaution so that medications can be given to you quickly during labor or delivery.  NORMAL LABOR AND DELIVERY IS DIVIDED UP INTO 3 STAGES: First Stage This is when regular contractions begin and the cervix begins to efface and dilate. This stage can last from 3 to 15 hours. The end of the first stage is when the cervix is 100% effaced and 10 centimeters dilated. Pain medications may be given by   Injection (morphine, demerol, etc.)   Regional anesthesia (spinal, caudal or epidural, anesthetics given in  different locations of the spine). Paracervical pain medication may be given, which is an injection of and anesthetic on each side of the cervix.  A pregnant woman may request to have "Natural Childbirth" which is not to have any medications or anesthesia during her labor and delivery. Second Stage This is when the baby comes down through the birth canal (vagina) and is born. This can take 1 to 4 hours. As the baby's head comes down through the birth canal, you may feel like you are going to have a bowel movement. You will get the urge to bear down and push until the baby is delivered. As the baby's head is being delivered, the caregiver will decide if an episiotomy (a cut in the perineum and vagina area) is needed to prevent tearing of the tissue in this area. The episiotomy is sewn up after the delivery of the baby and placenta. Sometimes a mask with nitrous oxide is given for the mother to breath during the delivery of the baby to help if there is too much pain. The end of Stage 2 is when the baby is fully delivered. Then when the umbilical cord stops pulsating it is clamped and cut. Third Stage The third stage begins after the baby is completely delivered and ends after the placenta (afterbirth) is delivered. This usually takes 5 to 30 minutes. After the placenta is delivered, a medication is given either by intravenous or injection to help contract   the uterus and prevent bleeding. The third stage is not painful and pain medication is usually not necessary. If an episiotomy was done, it is repaired at this time. After the delivery, the mother is watched and monitored closely for 1 to 2 hours to make sure there is no postpartum bleeding (hemorrhage). If there is a lot of bleeding, medication is given to contract the uterus and stop the bleeding. Document Released: 02/01/2008 Document Revised: 04/13/2011 Document Reviewed: 02/01/2008 [employer] Patient Information 2012 [employer]. 

## 2011-08-25 NOTE — MAU Note (Signed)
Patient states she is having contractions every 2-3 minutes. Denies any bleeding or leaking. Reports good fetal movement.

## 2011-08-25 NOTE — Telephone Encounter (Signed)
Spoke to pt who states she has been contracting q 2-3 mins since 1 am this morning. . GBS neg, + FM, no bleeding or loss of fluid. Per [facility] on call, pt is to report to MAU promptly. Melody Comas A

## 2011-08-28 ENCOUNTER — Telehealth (HOSPITAL_COMMUNITY): Payer: Self-pay | Admitting: *Deleted

## 2011-08-28 ENCOUNTER — Encounter (HOSPITAL_COMMUNITY): Payer: Self-pay | Admitting: *Deleted

## 2011-08-28 NOTE — Telephone Encounter (Signed)
Preadmission screen  

## 2011-08-29 ENCOUNTER — Ambulatory Visit (INDEPENDENT_AMBULATORY_CARE_PROVIDER_SITE_OTHER): Payer: Medicaid Other | Admitting: Obstetrics and Gynecology

## 2011-08-29 ENCOUNTER — Encounter: Payer: Self-pay | Admitting: Obstetrics and Gynecology

## 2011-08-29 VITALS — BP 102/68 | Wt 180.0 lb

## 2011-08-29 DIAGNOSIS — O289 Unspecified abnormal findings on antenatal screening of mother: Secondary | ICD-10-CM

## 2011-08-29 DIAGNOSIS — Z331 Pregnant state, incidental: Secondary | ICD-10-CM

## 2011-08-29 LAB — POCT URINALYSIS DIPSTICK
Blood, UA: NEGATIVE
Glucose, UA: NEGATIVE
Ketones, UA: NEGATIVE
Spec Grav, UA: 1.015

## 2011-08-29 NOTE — Progress Notes (Signed)
Pt.stated having some d/c  thick and white. Also having some back pain. No change in cervix Had increased risk of Down's syndrome on Quad screen, however, nl after EDC correction by [place].

## 2011-09-03 ENCOUNTER — Encounter (HOSPITAL_COMMUNITY): Payer: Self-pay

## 2011-09-03 ENCOUNTER — Inpatient Hospital Stay (HOSPITAL_COMMUNITY): Payer: Medicaid Other | Admitting: Anesthesiology

## 2011-09-03 ENCOUNTER — Inpatient Hospital Stay (HOSPITAL_COMMUNITY)
Admission: AD | Admit: 2011-09-03 | Discharge: 2011-09-04 | DRG: 774 | Disposition: A | Discharge status: Home / Self Care | Payer: Medicaid Other | Source: Ambulatory Visit | Attending: Obstetrics and Gynecology | Admitting: Obstetrics and Gynecology

## 2011-09-03 ENCOUNTER — Encounter (HOSPITAL_COMMUNITY): Payer: Self-pay | Admitting: Anesthesiology

## 2011-09-03 DIAGNOSIS — IMO0001 Reserved for inherently not codable concepts without codable children: Secondary | ICD-10-CM

## 2011-09-03 DIAGNOSIS — O0930 Supervision of pregnancy with insufficient antenatal care, unspecified trimester: Secondary | ICD-10-CM

## 2011-09-03 DIAGNOSIS — Z3480 Encounter for supervision of other normal pregnancy, unspecified trimester: Secondary | ICD-10-CM | POA: Diagnosis present

## 2011-09-03 DIAGNOSIS — O328XX0 Maternal care for other malpresentation of fetus, not applicable or unspecified: Secondary | ICD-10-CM | POA: Diagnosis present

## 2011-09-03 LAB — RPR: RPR Ser Ql: NONREACTIVE

## 2011-09-03 LAB — CBC
HCT: 36.8 % (ref 36.0–46.0)
Hemoglobin: 12.2 g/dL (ref 12.0–15.0)
RBC: 4.27 MIL/uL (ref 3.87–5.11)
WBC: 13.8 10*3/uL — ABNORMAL HIGH (ref 4.0–10.5)

## 2011-09-03 MED ORDER — FENTANYL 2.5 MCG/ML BUPIVACAINE 1/10 % EPIDURAL INFUSION (WH - ANES)
14.0000 mL/h | INTRAMUSCULAR | Status: DC
Start: 2011-09-03 — End: 2011-09-03
  Administered 2011-09-03: 14 mL/h via EPIDURAL
  Filled 2011-09-03: qty 60

## 2011-09-03 MED ORDER — EPHEDRINE 5 MG/ML INJ
10.0000 mg | INTRAVENOUS | Status: AC | PRN
  Administered 2011-09-03 (×2): 10 mg via INTRAVENOUS
  Filled 2011-09-03: qty 4

## 2011-09-03 MED ORDER — LANOLIN HYDROUS EX OINT: 1.0000 "application " | TOPICAL_OINTMENT | CUTANEOUS | Status: DC | PRN

## 2011-09-03 MED ORDER — LACTATED RINGERS IV SOLN
500.0000 mL | INTRAVENOUS | Status: DC | PRN
  Administered 2011-09-03 (×2): 500 mL via INTRAVENOUS

## 2011-09-03 MED ORDER — ACETAMINOPHEN 325 MG PO TABS: 650.0000 mg | ORAL_TABLET | ORAL | Status: DC | PRN

## 2011-09-03 MED ORDER — HYDROXYZINE HCL 50 MG PO TABS: 50.0000 mg | ORAL_TABLET | Freq: Four times a day (QID) | ORAL | Status: DC | PRN

## 2011-09-03 MED ORDER — TETANUS-DIPHTH-ACELL PERTUSSIS 5-2.5-18.5 LF-MCG/0.5 IM SUSP: 0.5000 mL | Freq: Once | INTRAMUSCULAR | Status: DC

## 2011-09-03 MED ORDER — WITCH HAZEL-GLYCERIN EX PADS: 1.0000 "application " | MEDICATED_PAD | CUTANEOUS | Status: DC | PRN

## 2011-09-03 MED ORDER — ONDANSETRON HCL 4 MG PO TABS: 4.0000 mg | ORAL_TABLET | ORAL | Status: DC | PRN

## 2011-09-03 MED ORDER — IBUPROFEN 600 MG PO TABS
600.0000 mg | ORAL_TABLET | Freq: Four times a day (QID) | ORAL | Status: DC | PRN
  Administered 2011-09-03: 600 mg via ORAL
  Filled 2011-09-03: qty 1

## 2011-09-03 MED ORDER — PHENYLEPHRINE 40 MCG/ML (10ML) SYRINGE FOR IV PUSH (FOR BLOOD PRESSURE SUPPORT)
80.0000 ug | PREFILLED_SYRINGE | INTRAVENOUS | Status: DC | PRN
  Filled 2011-09-03: qty 5

## 2011-09-03 MED ORDER — MEASLES, MUMPS & RUBELLA VAC ~~LOC~~ INJ
0.5000 mL | INJECTION | Freq: Once | SUBCUTANEOUS | Status: DC
  Filled 2011-09-03: qty 0.5

## 2011-09-03 MED ORDER — DIPHENHYDRAMINE HCL 25 MG PO CAPS: 25.0000 mg | ORAL_CAPSULE | Freq: Four times a day (QID) | ORAL | Status: DC | PRN

## 2011-09-03 MED ORDER — LACTATED RINGERS IV SOLN
500.0000 mL | Freq: Once | INTRAVENOUS | Status: AC
  Administered 2011-09-03: 500 mL via INTRAVENOUS

## 2011-09-03 MED ORDER — EPHEDRINE 5 MG/ML INJ
10.0000 mg | INTRAVENOUS | Status: DC | PRN
  Administered 2011-09-03: 10 mg via INTRAVENOUS
  Filled 2011-09-03: qty 4

## 2011-09-03 MED ORDER — METHYLERGONOVINE MALEATE 0.2 MG PO TABS: 0.2000 mg | ORAL_TABLET | ORAL | Status: DC | PRN

## 2011-09-03 MED ORDER — BENZOCAINE-MENTHOL 20-0.5 % EX AERO: 1.0000 "application " | INHALATION_SPRAY | CUTANEOUS | Status: DC | PRN

## 2011-09-03 MED ORDER — LIDOCAINE HCL (PF) 1 % IJ SOLN
INTRAMUSCULAR | Status: DC | PRN
  Administered 2011-09-03 (×3): 4 mL

## 2011-09-03 MED ORDER — HYDROXYZINE HCL 50 MG/ML IM SOLN: 50.0000 mg | Freq: Four times a day (QID) | INTRAMUSCULAR | Status: DC | PRN

## 2011-09-03 MED ORDER — LACTATED RINGERS IV SOLN
INTRAVENOUS | Status: DC
  Administered 2011-09-03 (×2): via INTRAVENOUS

## 2011-09-03 MED ORDER — OXYTOCIN 20 UNITS IN LACTATED RINGERS INFUSION - SIMPLE: 125.0000 mL/h | Freq: Once | INTRAVENOUS | Status: DC

## 2011-09-03 MED ORDER — FERROUS SULFATE 325 (65 FE) MG PO TABS
325.0000 mg | ORAL_TABLET | Freq: Two times a day (BID) | ORAL | Status: DC
  Administered 2011-09-04: 325 mg via ORAL
  Filled 2011-09-03: qty 1

## 2011-09-03 MED ORDER — CITRIC ACID-SODIUM CITRATE 334-500 MG/5ML PO SOLN: 30.0000 mL | ORAL | Status: DC | PRN

## 2011-09-03 MED ORDER — METHYLERGONOVINE MALEATE 0.2 MG/ML IJ SOLN: 0.2000 mg | INTRAMUSCULAR | Status: DC | PRN

## 2011-09-03 MED ORDER — MAGNESIUM HYDROXIDE 400 MG/5ML PO SUSP: 30.0000 mL | ORAL | Status: DC | PRN

## 2011-09-03 MED ORDER — OXYCODONE-ACETAMINOPHEN 5-325 MG PO TABS
1.0000 | ORAL_TABLET | ORAL | Status: DC | PRN
  Administered 2011-09-03: 1 via ORAL
  Filled 2011-09-03: qty 1

## 2011-09-03 MED ORDER — FLEET ENEMA 7-19 GM/118ML RE ENEM: 1.0000 | ENEMA | RECTAL | Status: DC | PRN

## 2011-09-03 MED ORDER — NALBUPHINE SYRINGE 5 MG/0.5 ML: 5.0000 mg | INJECTION | INTRAMUSCULAR | Status: DC | PRN

## 2011-09-03 MED ORDER — PHENYLEPHRINE 40 MCG/ML (10ML) SYRINGE FOR IV PUSH (FOR BLOOD PRESSURE SUPPORT): 80.0000 ug | PREFILLED_SYRINGE | INTRAVENOUS | Status: DC | PRN

## 2011-09-03 MED ORDER — MISOPROSTOL 200 MCG PO TABS
ORAL_TABLET | ORAL | Status: AC
  Administered 2011-09-03: 800 ug via VAGINAL
  Filled 2011-09-03: qty 4

## 2011-09-03 MED ORDER — PRENATAL MULTIVITAMIN CH
1.0000 | ORAL_TABLET | Freq: Every day | ORAL | Status: DC
  Administered 2011-09-04: 1 via ORAL
  Filled 2011-09-03: qty 1

## 2011-09-03 MED ORDER — IBUPROFEN 600 MG PO TABS
600.0000 mg | ORAL_TABLET | Freq: Four times a day (QID) | ORAL | Status: DC
  Administered 2011-09-04 (×3): 600 mg via ORAL
  Filled 2011-09-03 (×3): qty 1

## 2011-09-03 MED ORDER — DIBUCAINE 1 % RE OINT: 1.0000 "application " | TOPICAL_OINTMENT | RECTAL | Status: DC | PRN

## 2011-09-03 MED ORDER — ONDANSETRON HCL 4 MG/2ML IJ SOLN: 4.0000 mg | INTRAMUSCULAR | Status: DC | PRN

## 2011-09-03 MED ORDER — LIDOCAINE HCL (PF) 1 % IJ SOLN
30.0000 mL | INTRAMUSCULAR | Status: DC | PRN
  Filled 2011-09-03: qty 30

## 2011-09-03 MED ORDER — ZOLPIDEM TARTRATE 5 MG PO TABS: 5.0000 mg | ORAL_TABLET | Freq: Every evening | ORAL | Status: DC | PRN

## 2011-09-03 MED ORDER — SIMETHICONE 80 MG PO CHEW: 80.0000 mg | CHEWABLE_TABLET | ORAL | Status: DC | PRN

## 2011-09-03 MED ORDER — OXYTOCIN BOLUS FROM INFUSION
500.0000 mL | Freq: Once | INTRAVENOUS | Status: AC
  Administered 2011-09-03: 500 mL via INTRAVENOUS
  Filled 2011-09-03: qty 500
  Filled 2011-09-03: qty 1000

## 2011-09-03 MED ORDER — SENNOSIDES-DOCUSATE SODIUM 8.6-50 MG PO TABS: 2.0000 | ORAL_TABLET | Freq: Every day | ORAL | Status: DC

## 2011-09-03 MED ORDER — OXYCODONE-ACETAMINOPHEN 5-325 MG PO TABS: 1.0000 | ORAL_TABLET | ORAL | Status: DC | PRN

## 2011-09-03 MED ORDER — DIPHENHYDRAMINE HCL 50 MG/ML IJ SOLN: 12.5000 mg | INTRAMUSCULAR | Status: DC | PRN

## 2011-09-03 MED ORDER — ONDANSETRON HCL 4 MG/2ML IJ SOLN: 4.0000 mg | Freq: Four times a day (QID) | INTRAMUSCULAR | Status: DC | PRN

## 2011-09-03 MED ORDER — MISOPROSTOL 200 MCG PO TABS
800.0000 ug | ORAL_TABLET | Freq: Once | ORAL | Status: AC
  Administered 2011-09-03: 800 ug via VAGINAL

## 2011-09-03 NOTE — Anesthesia Procedure Notes (Signed)
Epidural Patient location during procedure: OB Start time: 09/03/2011 11:19 AM Reason for block: procedure for pain  Staffing Performed by: anesthesiologist   Preanesthetic Checklist Completed: patient identified, site marked, surgical consent, pre-op evaluation, timeout performed, IV checked, risks and benefits discussed and monitors and equipment checked  Epidural Patient position: sitting Prep: site prepped and draped and DuraPrep Patient monitoring: continuous pulse ox and blood pressure Approach: midline Injection technique: LOR air  Needle:  Needle type: Tuohy  Needle gauge: 17 G Needle length: 9 cm Needle insertion depth: 5 cm cm Catheter type: closed end flexible Catheter size: 19 Gauge Catheter at skin depth: 10 cm Test dose: negative  Assessment Events: blood not aspirated, injection not painful, no injection resistance, negative IV test and no paresthesia  Additional Notes Discussed risk of headache, infection, bleeding, nerve injury and failed or incomplete block.  Patient voices understanding and wishes to proceed.

## 2011-09-03 NOTE — Progress Notes (Signed)
Christine Morrow is a 31 y.o. [phone] at [redacted]w[redacted]d.  Subjective: Comfortable w/ epidural  Objective: BP 87/43  Pulse 72  Temp(Src) 98.5 F (36.9 C) (Oral)  Resp 18  Ht 5' 3.5" (1.613 m)  Wt 82.101 kg (181 lb)  BMI 31.56 kg/m2  SpO2 100%   Patient Vitals for the past 24 hrs:  BP Temp Temp src Pulse Resp SpO2 Height Weight  09/03/11 1201 87/43 mmHg - - 72  - - - -  09/03/11 1156 91/45 mmHg - - 87  - - - -  09/03/11 1151 90/49 mmHg 98.5 F (36.9 C) Oral 101  18  - - -  09/03/11 1147 94/49 mmHg - - 71  - - - -  09/03/11 1145 90/49 mmHg - - 79  - - - -  09/03/11 1143 88/51 mmHg - - 85  - 98 % - -  09/03/11 1141 81/38 mmHg - - 70  - - - -  09/03/11 1140 77/36 mmHg - - 60  - - - -  09/03/11 1139 78/34 mmHg - - 65  - - - -  09/03/11 1137 75/38 mmHg - - 58  - - - -  09/03/11 1136 62/29 mmHg - - 72  - - - -   FHT:  FHR: 140 bpm, variability: moderate,  accelerations:  Present,  decelerations:  Present three variables post-epidural, resolved UC:   regular, every 2-3 minutes, strong SVE:   Dilation: 5 Effacement (%): 90 Station: -2 Exam by:: Renaldo Harrison, RN AROM'd small amount of blood-tinged fluid  Labs: Lab Results  Component Value Date   WBC 13.8* 09/03/2011   HGB 12.2 09/03/2011   HCT 36.8 09/03/2011   MCV 86.2 09/03/2011   PLT 214 09/03/2011    Assessment / Plan: Spontaneous labor, progressing normally  Labor: Progressing normally Preeclampsia:  NA Fetal Wellbeing:  Category I-II. Variables likely due to post-epidural hypotension Pain Control:  Epidural I/D:  n/a Anticipated MOD:  NSVD  Sayeed Weatherall 09/03/2011, 12:13 PM

## 2011-09-03 NOTE — H&P (Signed)
HPI: Christine Morrow is a 31 y.o. year old G87P2012 female at [redacted]w[redacted]d weeks gestation by 20 week [place]. who presents to [facility] reporting Labor and 1 episode of ? LOF. She was 2 cm on last exam.  Maternal Medical History:  Reason for admission: Reason for admission: contractions.  Reason for Admission:   nauseaContractions: Onset was 3-5 hours ago.   Frequency: regular.   Perceived severity is strong.    Fetal activity: Perceived fetal activity is normal.   Last perceived fetal movement was within the past hour.      Patient Active Problem List  Diagnoses  . Normal pregnancy, repeat  . Insufficient prenatal care   Prenatal course: The pt started care at 16 week by LMP. Quad screen was initially reported as increased DSR, but after her anatomy scan showed that she was two weeks behind EDD by LMP, quad was recalculated and was normal.  OB History    Grav Para Term Preterm Abortions TAB SAB Ect Mult Living   4 2 2  0 1 1 0 0 0 2      Past Medical History  Diagnosis Date  . No pertinent past medical history   . History of chlamydia   . History of chicken pox   . Late prenatal care    Past Surgical History  Procedure Date  . No past surgeries    Family History: family history includes Diabetes in her father; Heart attack in her father; Heart disease in her father and maternal grandmother; Learning disabilities in her brother; Mental illness in her brother; and Stroke in her father.  There is no history of Anesthesia problems. Social History:  reports that she has never smoked. She does not have any smokeless tobacco history on file. She reports that she does not drink alcohol or use illicit drugs.  Review of Systems  Eyes: Negative for blurred vision.  Gastrointestinal: Positive for abdominal pain. Negative for nausea and vomiting. Heartburn: contractions.  Neurological: Negative for headaches.    Dilation: 3.5 Effacement (%): 80 Station: -3 Exam by:: [place] CNM There were no  vitals taken for this visit. Maternal Exam:  Uterine Assessment: Contraction strength is moderate.  Contraction duration is 3 minutes. Contraction frequency is regular.   Abdomen: Fundal height is S=D.   Fetal presentation: vertex  Introitus: Normal vulva. Normal vagina.  Ferning test: negative.   Pelvis: adequate for delivery.   Cervix: Cervix evaluated by digital exam.     Fetal Exam Fetal Monitor Review: Mode: ultrasound.   Baseline rate: 130.  Variability: moderate (6-25 bpm).   Pattern: accelerations present and no decelerations.    Fetal State Assessment: Category I - tracings are normal.    There were no vitals taken for this visit.  Physical Exam  Nursing note and vitals reviewed. Constitutional: She is oriented to person, place, and time. She appears well-developed and well-nourished. She appears distressed (moderately).  HENT:  Head: Normocephalic.  Eyes: Pupils are equal, round, and reactive to light.  Cardiovascular: Normal rate and regular rhythm.   Respiratory: Effort normal and breath sounds normal.  GI: Soft. There is no tenderness.  Genitourinary:       Mucus, scant bloody show  Musculoskeletal: Normal range of motion. She exhibits edema (1+).  Neurological: She is alert and oriented to person, place, and time. She has normal reflexes.  Skin: Skin is warm and dry.  Psychiatric: She has a normal mood and affect.   Dilation: 3.5 Effacement (%): 80  Cervical Position: Middle Station: -3 Presentation: Vertex Exam by:: Dorathy Kinsman CNM  Prenatal labs: ABO, Rh: A/Positive/-- (10/31 0000) Antibody: Negative (10/31 0000) Rubella: Immune (10/31 0000) RPR: Nonreactive (10/31 0000)  HBsAg: Negative (10/31 0000)  HIV: Non-reactive (10/31 0000)  GBS: Negative (04/03 0000)  1 hour GTT 118 Quad screen normal  Assessment: 1. Labor: early-active 2. Fetal Wellbeing: Category I  3. Pain Control: Plans epidural 4. GBS: neg 5. 39.4 week IUP  Plan:  1.  Admit to BS per consult with MD 2. Routine L&D orders 3. Analgesia/anesthesia PRN   Dorathy Kinsman 09/03/2011, 10:06 AM

## 2011-09-03 NOTE — MAU Note (Signed)
Contractions since 0500 this morning with some bloody show every 5 minutes

## 2011-09-03 NOTE — MAU Note (Signed)
efm strip reviewed by Dorathy Kinsman CNM patient turned to left side, plan of care was discussed to recheck cervix in 1 to 1 1/2 hours.

## 2011-09-03 NOTE — Progress Notes (Signed)
FOB offered stationary chair for epidural placement. FOB desires to sit in chair during procedure.

## 2011-09-03 NOTE — Anesthesia Preprocedure Evaluation (Signed)

## 2011-09-04 LAB — CBC
Platelets: 191 10*3/uL (ref 150–400)
RBC: 3.56 MIL/uL — ABNORMAL LOW (ref 3.87–5.11)
WBC: 14.1 10*3/uL — ABNORMAL HIGH (ref 4.0–10.5)

## 2011-09-04 MED ORDER — OXYCODONE-ACETAMINOPHEN 5-325 MG PO TABS: 1.0000 | ORAL_TABLET | ORAL | Status: AC | PRN

## 2011-09-04 MED ORDER — NORETHINDRONE 0.35 MG PO TABS: 1.0000 | ORAL_TABLET | Freq: Every day | ORAL | Status: AC

## 2011-09-04 MED ORDER — IBUPROFEN 600 MG PO TABS: 600.0000 mg | ORAL_TABLET | Freq: Four times a day (QID) | ORAL | Status: AC | PRN

## 2011-09-04 NOTE — Discharge Summary (Signed)
Obstetric Discharge Summary Reason for Admission: onset of labor Prenatal Procedures: ultrasound Intrapartum Procedures: spontaneous vaginal delivery Postpartum Procedures: none Complications-Operative and Postpartum: none Hemoglobin  Date Value Range Status  09/04/2011 10.2* 12.0-15.0 (g/dL) Final     HCT  Date Value Range Status  09/04/2011 31.0* 36.0-46.0 (%) Final   Hospital Course: Admitted in labor on 09/03/11.  Negative GBS. Progressed well, with epidural placed for comfort. Delivery was performed by Dorathy Kinsman, CNM, without complication. Patient and baby tolerated the procedure without difficulty, with no laceration noted. She had a moderate amount of immediate postpartum bleeding, with Cyt[employer] placed prophylactically.  No further significant bleeding noted.  Infant to FTN. Mother and infant then had an uncomplicated postpartum course, with breast feeding going well. Mom's physical exam remained WNL, and she was discharged home in stable condition. Contraception plan was Micronor, with possible IUD in the future.  She received adequate benefit from po pain medications, and was using both Motrin and Percocet.      Physical Exam:  General: alert Lochia: appropriate Uterine Fundus: firm Incision: No lacerations DVT Evaluation: No evidence of DVT seen on physical exam. Negative Homan's sign.  Discharge Diagnoses: Term Pregnancy-delivered  Discharge Information: Date: 09/04/2011 Activity: Per [facility] handout Diet: routine Medications: Ibuprofen, Percocet and Micronor Condition: stable Instructions: refer to practice specific booklet Discharge to: home Contraception:  Micronor, then may plan IUD Follow-up Information    Follow up with [facility] in 6 months. (Call as needed)          Newborn Data: Live born female  Birth Weight: 7 lb 6 oz (3345 g) APGAR: 9, 9  Home with mother.  Nigel Bridgeman 09/04/2011, 7:56 AM

## 2011-09-04 NOTE — Progress Notes (Signed)
UR chart review completed.  

## 2011-09-04 NOTE — Discharge Instructions (Signed)

## 2011-09-04 NOTE — Anesthesia Postprocedure Evaluation (Signed)
  Anesthesia Post-op Note  Patient: Christine Morrow  Procedure(s) Performed: * No surgery found *  Patient Location: Mother/Baby  Anesthesia Type: Epidural  Level of Consciousness: awake  Airway and Oxygen Therapy: Patient Spontanous Breathing  Post-op Pain: none  Post-op Assessment: Patient's Cardiovascular Status Stable, Respiratory Function Stable, Patent Airway, No signs of Nausea or vomiting, Adequate PO intake, Pain level controlled, No headache, No backache, No residual numbness and No residual motor weakness  Post-op Vital Signs: Reviewed and stable  Complications: No apparent anesthesia complications

## 2011-09-05 ENCOUNTER — Encounter: Payer: Medicaid Other | Admitting: Obstetrics and Gynecology

## 2011-09-07 ENCOUNTER — Telehealth: Payer: Self-pay | Admitting: Obstetrics and Gynecology

## 2011-09-07 NOTE — Telephone Encounter (Signed)
Spoke with pt rgd msg pt states delivered a few days ago passed a clot want to know if normal advised pt increase water intake try to be mobile pt voice understanding

## 2011-09-07 NOTE — Telephone Encounter (Signed)
ROUTED TO TRIAGE

## 2011-10-16 ENCOUNTER — Ambulatory Visit (INDEPENDENT_AMBULATORY_CARE_PROVIDER_SITE_OTHER): Payer: Medicaid Other

## 2011-10-16 DIAGNOSIS — Z8619 Personal history of other infectious and parasitic diseases: Secondary | ICD-10-CM

## 2011-10-16 NOTE — Progress Notes (Signed)
Christine Morrow  is 6 weeks postpartum following a spontaneous vaginal delivery at 22  gestational weeks Date: 09/03/11 female baby named [name]  delivered by [place] CNM (pleased w/ delivery) SVD w/ epidural at 39.4 wk.  Breastfeeding: yes Bottlefeeding:  yes Denies any s/s of PPD, thrush, or mastitis.  Post-partum blues / depression:  yes  EPDS score: 0  History of abnormal Pap:  no  Last Pap: Date  03/08/2011 wnl  Gestational diabetes:  no  Contraception:  Desires oral contraceptives (estrogen/progesterone) pt has not stared yet  Pt has Micronor Rx and intends to start this week.  Normal urinary function:  yes Normal GI function:  yes Returning to work:  yes  O:  .Marland Kitchen Filed Vitals:   10/16/11 1200  Weight: 171 lb (77.565 kg)  PE:  Gen: NAD, A&Ox3, pleasant  Abd:  Soft, NT, no organomegaly, no significant diastasis  Pelvic:  Normal uterine involution.  Kegel 3-4/5.  Vulva WNL  Ext:  WNL  A:  Normal PP exam      Lactating     31y.o. WF P3013     Only about 9lb wt loss since delivery; current BMI=about 29     No contraception yet, but has Micronor Rx already and intends to start P:  RTO in May 2014 for Aex and Pap, or f/u prn      Rec'd daily kegels, sunscreen, daily MVI, continued wt loss and healthy lifestyle     Condoms for 1-2 weeks for back-up w/ Micronor initiation; stressed compliance.

## 2011-10-19 DIAGNOSIS — Z8619 Personal history of other infectious and parasitic diseases: Secondary | ICD-10-CM

## 2011-10-19 HISTORY — DX: Personal history of other infectious and parasitic diseases: Z86.19

## 2012-10-16 ENCOUNTER — Emergency Department (INDEPENDENT_AMBULATORY_CARE_PROVIDER_SITE_OTHER)
Admission: EM | Admit: 2012-10-16 | Discharge: 2012-10-16 | Disposition: A | Discharge status: Home / Self Care | Payer: Self-pay | Source: Home / Self Care | Attending: Emergency Medicine | Admitting: Emergency Medicine

## 2012-10-16 ENCOUNTER — Emergency Department (INDEPENDENT_AMBULATORY_CARE_PROVIDER_SITE_OTHER): Payer: Self-pay

## 2012-10-16 ENCOUNTER — Encounter (HOSPITAL_COMMUNITY): Payer: Self-pay | Admitting: Emergency Medicine

## 2012-10-16 DIAGNOSIS — S43429A Sprain of unspecified rotator cuff capsule, initial encounter: Secondary | ICD-10-CM

## 2012-10-16 DIAGNOSIS — S20219A Contusion of unspecified front wall of thorax, initial encounter: Secondary | ICD-10-CM

## 2012-10-16 DIAGNOSIS — S20212A Contusion of left front wall of thorax, initial encounter: Secondary | ICD-10-CM

## 2012-10-16 DIAGNOSIS — S46012A Strain of muscle(s) and tendon(s) of the rotator cuff of left shoulder, initial encounter: Secondary | ICD-10-CM

## 2012-10-16 MED ORDER — IBUPROFEN 800 MG PO TABS
ORAL_TABLET | ORAL | Status: AC
  Filled 2012-10-16: qty 1

## 2012-10-16 MED ORDER — IBUPROFEN 800 MG PO TABS: 800.0000 mg | ORAL_TABLET | Freq: Three times a day (TID) | ORAL | Status: AC

## 2012-10-16 MED ORDER — IBUPROFEN 800 MG PO TABS
800.0000 mg | ORAL_TABLET | Freq: Once | ORAL | Status: AC
  Administered 2012-10-16: 800 mg via ORAL

## 2012-10-16 MED ORDER — OXYCODONE-ACETAMINOPHEN 5-325 MG PO TABS: ORAL_TABLET | ORAL | Status: AC

## 2012-10-16 NOTE — ED Provider Notes (Signed)
Chief Complaint:   Chief Complaint  Patient presents with  . Chest Pain    History of Present Illness:   Christine Morrow is a 32 year old female who slipped on a toy this past Saturday, 5 days ago and landed on her back. She did not hit her head and there was no loss of consciousness. Initially she had pain in her right shoulder and right chest, but then this went away and the pain settled in her left shoulder and left chest. It hurts to move her neck, move her shoulder, breathing, laugh, or cough. She denies any pain in the neck itself. She has no pain over the sternum. She denies any shortness of breath, wheezing, or hemoptysis. She has had no pain radiating down the arm, numbness, tingling, weakness in arm. It hurts to move the shoulder. She denies any abdominal pain, or lower extremity pain. The pain is rated 8-9 or 10 in intensity.  Review of Systems:  Other than noted above, the patient denies any of the following symptoms: Systemic:  No fevers or chills. Eye:  No diplopia or blurred vision. ENT:  No headache, facial pain, or bleeding from the nose or ears.  No loose or broken teeth. Neck:  No neck pain or stiffnes. Resp:  No shortness of breath. Cardiac:  No chest pain. No palpitations, dizziness, syncope or fainting. GI:  No abdominal pain. No nausea, vomiting, or diarrhea. GU:  No blood in urine. M-S:  No extremity pain, swelling, bruising, limited ROM, neck or back pain. Neuro:  No headache, loss of consciousness, seizure activity, dizziness, vertigo, paresthesias, numbness, or weakness.  No difficulty with speech or ambulation.  PMFSH:  Past medical history, family history, social history, meds, and allergies were reviewed.    Physical Exam:   Vital signs:  BP 114/74  Pulse 66  Temp(Src) 99 F (37.2 C) (Oral)  Resp 17  SpO2 98%  LMP 09/20/2012  Breastfeeding? No General:  Alert, oriented and in no distress. Eye:  PERRL, full EOMs. ENT:  No cranial or facial tenderness to  palpation. Neck:  There is slight tenderness to palpation over the left trapezius ridge.  Full ROM without pain. Heart:  Regular rhythm.  No extrasystoles, gallops, or murmers. Lungs:  No chest wall tenderness to palpation. Breath sounds clear and equal bilaterally.  No wheezes, rales or rhonchi. Chest: She has diffuse pain to palpation over the entire left hemithorax, but not over the sternum or over the right hemithorax. There is no swelling, bruising, or deformity. Abdomen:  Non tender. Back:  Non tender to palpation.  Full ROM without pain. Extremities:  She has tenderness to palpation over the shoulder. Her shoulder has a full range of motion both actively and passively but she has pain with abduction and flexion. Impingement signs are positive.  Full ROM of all joints without pain.  Pulses full.  Brisk capillary refill. Neuro:  Alert and oriented times 3.  Cranial nerves intact.  No muscle weakness.  Sensation intact to light touch.  Gait normal. Skin:  No bruising, abrasions, or lacerations.  Radiology:  Dg Ribs Unilateral W/chest Left  10/16/2012   *RADIOLOGY REPORT*  Clinical Data: Fall. Left chest pain.  LEFT RIBS AND CHEST - 3+ VIEW  Comparison: None.  Findings: The heart size is normal.  The lungs are clear.  The visualized soft tissues are unremarkable.  Dedicated imaging of the ribs demonstrates no evidence for acute or healing fracture.  IMPRESSION:  1.  No  acute cardiopulmonary disease. 2.  Negative left rib films.   Original Report Authenticated By: Marin Roberts, M.D.   Dg Shoulder Left  10/16/2012   *RADIOLOGY REPORT*  Clinical Data: Scapular, chest pain.  LEFT SHOULDER - 2+ VIEW  Comparison: None.  Findings: No acute osseous or joint abnormality.  IMPRESSION: Negative.   Original Report Authenticated By: Leanna Battles, M.D.    Course in Urgent Care Center:   Given ibuprofen 800 mg by mouth and she will be driving home. She was given a sling for  immobilization.  Assessment:  The primary encounter diagnosis was Rib contusion, left, initial encounter. A diagnosis of Rotator cuff strain, left, initial encounter was also pertinent to this visit.  No evidence of fracture or pulmonary injury.  Plan:   1.  The following meds were prescribed:   Discharge Medication List as of 10/16/2012  3:50 PM    START taking these medications   Details  !! ibuprofen (ADVIL,MOTRIN) 800 MG tablet Take 1 tablet (800 mg total) by mouth 3 (three) times daily., Starting 10/16/2012, Until Discontinued, Normal    oxyCODONE-acetaminophen (PERCOCET) 5-325 MG per tablet 1 to 2 tablets every 6 hours as needed for pain., Print     !! - Potential duplicate medications found. Please discuss with provider.     2.  The patient was instructed in symptomatic care and handouts were given. 3.  The patient was told to return if becoming worse in any way, if no better in 3 or 4 days, and given some red flag symptoms such as difficulty breathing, dizziness, or syncope that would indicate earlier return. 4.  Follow up with Dr. Ranell Patrick next week.    Reuben Likes, MD 10/16/12 2039

## 2012-10-16 NOTE — ED Notes (Signed)
Pain in right shoulder, right chest and right shoulder blade- this started over the week end and resolved as pain started in left shoulder, chest and shoulder blade.  This left side pain much more painful than right.  Pain is under left shoulder blade, under left breast and initially noticed with movement of left arm, now pain is constant.

## 2012-10-16 NOTE — ED Notes (Signed)
Returned to treatment room, provided water and ice

## 2012-10-16 NOTE — ED Notes (Signed)
Patient not in treatment room 

## 2014-03-09 ENCOUNTER — Encounter (HOSPITAL_COMMUNITY): Payer: Self-pay | Admitting: Emergency Medicine

## 2015-05-21 ENCOUNTER — Other Ambulatory Visit: Payer: Self-pay | Admitting: Nurse Practitioner

## 2015-05-21 DIAGNOSIS — Z1329 Encounter for screening for other suspected endocrine disorder: Secondary | ICD-10-CM

## 2015-05-21 DIAGNOSIS — Z131 Encounter for screening for diabetes mellitus: Secondary | ICD-10-CM

## 2015-05-21 DIAGNOSIS — Z13220 Encounter for screening for lipoid disorders: Secondary | ICD-10-CM

## 2015-12-21 ENCOUNTER — Encounter: Payer: Self-pay | Admitting: *Deleted

## 2015-12-21 NOTE — Progress Notes (Signed)
This encounter was created in error - please disregard.
